# Patient Record
Sex: Male | Born: 1995 | Marital: Single
Health system: Southern US, Community
[De-identification: ages and names within clinical notes are randomized; demographics above are authoritative.]

---
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
## 2013-07-27 ENCOUNTER — Ambulatory Visit: Payer: Self-pay | Admitting: Otolaryngology

## 2013-07-28 LAB — PATHOLOGY REPORT

## 2014-05-05 NOTE — H&P (Signed)
    Subjective/Chief Complaint Right perianal/gluteal pain x 1 week    History of Present Illness [name] is a pleasant 19 yo M who presents with approx 1 week of perianal pain.  He says that his pain began on Monday acutely.  He had some purulent discharge on his underwear and felt an area of induration.  He said that his discharge became worse and pain increased as well.  Has been on augmentin x 3 days.  Dr. Bluford Kaufmannh performed colonoscopy today and believed that he saw a wound concerning for a fistulous opening.  No fevers, chills, night sweats, shortness of breath, cough, dysuria/hematuria, abdominal pain, nausea/vomiting, diarrhea/constipation.   Family and Social History:   Family History Non-Contributory    Social History negative tobacco, negative ETOH, negative Illicit drugs    Place of Living Home  With mother and father   Review of Systems:   Fever/Chills No    Cough No    Sputum No    Abdominal Pain No    Diarrhea No    Constipation No    Nausea/Vomiting No    SOB/DOE No    Chest Pain No    Dysuria No    Tolerating Diet Yes   Physical Exam:   GEN well developed, no acute distress    HEENT pink conjunctivae, PERRL, hearing intact to voice, good dentition    NECK No masses    RESP normal resp effort  clear BS  no use of accessory muscles    CARD regular rate  no murmur  No LE edema    ABD denies tenderness  denies Flank Tenderness  no hernia  soft  normal BS  no Abdominal Bruits  no Adominal Mass    EXTR negative cyanosis/clubbing, negative edema    SKIN No rashes, No ulcers    NEURO negative rigidity, negative tremor    PSYCH alert, A+O to time, place, person, good insight    Additional Comments + Perianal pain with midline purulent drainage posterior to anus, area of induration deep.     Assessment/Admission Diagnosis [name] is a pleasant 19 yo M with probable perianal abscess.    Plan To OR for EUA, probable drainage of perianal abscess    Electronic Signatures: Jarvis NewcomerLundquist, Nicholus Chandran A (MD)  (Signed 31-Jan-14 14:50)  Authored: CHIEF COMPLAINT and HISTORY, FAMILY AND SOCIAL HISTORY, REVIEW OF SYSTEMS, PHYSICAL EXAM, ASSESSMENT AND PLAN   Last Updated: 31-Jan-14 14:50 by Jarvis NewcomerLundquist, Doryan Bahl A (MD)

## 2014-05-06 NOTE — Op Note (Signed)
PATIENT NAME:  Roberto Navarro, Roberto Navarro MR#:  [id number] DATE OF BIRTH:  [birth year]  DATE OF PROCEDURE:  07/27/2013  PREOPERATIVE DIAGNOSES:  1. Septal deviation.  2. Airway obstruction. 3. Turbinate hypertrophy.  4. Chronic bilateral maxillary sinusitis.  5. Chronic bilateral frontal sinusitis.   POSTOPERATIVE DIAGNOSES:  1. Septal deviation.  2. Airway obstruction. 3. Turbinate hypertrophy.  4. Chronic bilateral maxillary sinusitis.  5. Chronic bilateral frontal sinusitis.   OPERATIVE PROCEDURES:  1. Septoplasty.  2. Bilateral endoscopic maxillary antrostomies.  3. Bilateral endoscopic frontal sinusotomies.  4. Bilateral inferior partial turbinate reduction.  5. Use of image-guided system.   SURGEON: Cammy Copa, M.D.   ANESTHESIA: General.   COMPLICATIONS: None.   TOTAL BLOOD LOSS: 75 mL.  DESCRIPTION OF PROCEDURE: The patient was given general anesthesia by oral endotracheal intubation. He is in the supine position in the operating room. The nose was prepped using 4% Xylocaine mixed with Afrin on cotton pledgets in the nose; 5 mL of 1% Xylocaine with epinephrine 1:200,000 was used for infiltration of the nasal septum on both sides of the nose. The septum was markedly deviated to the right side.   The image-guided system was then brought in. The CT scan was downloaded to the system. The template was then applied to the face and this was registered to the system, as well. There was 0.5 mm of variance, which was good. The suction instruments were registered to the system and these showed great alignment with the patient's anatomy and the system. He was prepped and draped in sterile fashion.   The septum was addressed first. Navarro left hemitransfixion incision was created with elevation of mucoperichondrium on both sides of the quadrangular plate. Once I had this freed up, you could see the cartilage buckled way off to the right side, make Navarro sharp bend, and then go horizontally back to the  maxillary crest that was overhanging this. The horizontal portion of the septum was removed to allow the vertical portion to swing back more towards the midline. The maxillary crest had Navarro big bony spur off to his right side. It was pushing on the inferior turbinate. The mucoperiosteum was elevated over the top of this and then Navarro chisel was used to free up some of the maxillary bone and remove Navarro chunk that was sticking out into the right nasal airway, causing part of the obstruction. The mucoperiosteum was elevated over the vomer and ethmoid plate. There was Navarro large spur posteriorly on the left side of the vomer. This was fractured and removed. Some of the ethmoid plate was removed, as well. The rest was sitting in the midline. The mucosal flaps were allowed to fall back into the midline, and this opened up the airway significantly on the right side. The inferior turbinates were trimmed slightly along their inferior border. They were cauterized Navarro little bit here, and the remaining portion was outfractured to create more airway on both sides of the nose and more visualization.   The mucosal flaps of the septum were sutured back in position using Navarro 4-0 plain gut suture in Navarro through and through whipstitch fashion, and 3-0 chromics were used to anchor the septum back to the midline at the anterior nasal spine.   Navarro zero-degree scope was then used for visualizing the nose, opening the airway on both sides. Navarro little more of local anesthesia on Navarro 25-gauge spinal needle was used for infiltration of the uncinate process on both sides, Navarro total of 2  mm more of local was used bilaterally.   The left side was visualized first.  Dictation ends here.  Please see addendum for continuation of dictation.     ____________________________ Cammy CopaPaul H. Deepika Decatur, MD phj:jr D: 07/27/2013 17:19:00 ET T: 07/27/2013 18:30:24 ET JOB#: [id number]  cc: Cammy CopaPaul H. Floetta Brickey, MD, <Dictator> Cammy CopaPAUL H Maynor Mwangi MD ELECTRONICALLY SIGNED 07/28/2013  9:01

## 2014-05-06 NOTE — Op Note (Signed)
PATIENT NAME:  Roberto Navarro, Kipton A MR#:  [id number] DATE OF BIRTH:  [birth year]  DATE OF PROCEDURE:  07/27/2013  CONTINUATION OF DICTATION  The zero-degree scope was used for visualization of the left side first. The middle turbinate was infractured to see the middle meatus better. The uncinate process was very long and floppy. I tried to cannulize into the maxillary sinus and had to trim a little bit of the uncinate process to get into there. I used the Acclarent balloon system to dilate the opening here. I then used the backbiting forceps to trim more of the uncinate process and around the opening to the maxillary sinus, to make sure that there was a clear visual opening into the maxillary sinus. There is no significant disease in the sinus right now.   The uncinate process was trimmed some anteriorly. The Acclarent balloon system was then used for canalizing the left frontal sinus. You could see the pinpoint light in the frontal sinus. This was dilated to 12 cm of pressure, as well. The frontal sinus instruments were then used to trim the opening around the frontal sinus duct to make sure that this was removed. Some bone was removed from the area to clear this and have a good opening into the frontal sinus. A cottonoid pledget was placed here on the left side once these both were opened up to help with vasoconstriction.   The zero-degree scope was used on the right side to visualize the sinuses. The middle turbinate was very lateralized because of the ethmoid plate being pulled over to the right. This was medialized to provide better access into the middle meatus. Again, the maxillary sinus was cannulated first with the Acclarent balloon system. Once it was opened up, the uncinate process had to be trimmed some and provide a better opening here. The Diego microdebrider was used along the uncinate process, as well as the opening in the maxillary antrum. The uncinate process was removed more anteriorly to expose  part of the Agger Nasi cell. The frontal sinus, it was very hard to find the opening here. Had to use the frontal sinus instruments to remove more and more of the anterior ethmoid air cells to finally be able to get into the frontal sinus duct. Once I was there, I dilated to 12 cm of pressure. I then used through-biting forceps to widen this and make sure the opening was clear. A cottonoid pledget was placed here temporarily again.   The left side was revisualized. The frontal sinus duct was opened and the maxillary sinus was opened. You could see clearly and above. Xerogel was placed in the middle meatus to help prevent scarring in the area. On the right side, this was revisualized again, and the frontal sinus and maxillary sinus were open and clear. Again, xerogel was placed in the middle meatus to prevent scarring. The inferior airway was open and clear on both sides. The nasal septum was straight. Xomed 0.5 mm regular size splints were then placed on both sides of the nasal septum and held in place with a 3-0 nylon through and through suture.   The patient tolerated the procedure well. He was awakened and taken to the recovery room in satisfactory condition. Total estimated blood loss was 75 mL.    ____________________________ Cammy CopaPaul H. Zriyah Kopplin, MD phj:jr D: 07/27/2013 17:24:42 ET T: 07/27/2013 18:39:43 ET JOB#: [id number]  cc: Cammy CopaPaul H. Andriana Casa, MD, <Dictator> Cammy CopaPAUL H Trany Chernick MD ELECTRONICALLY SIGNED 07/28/2013 9:01

## 2017-03-18 ENCOUNTER — Other Ambulatory Visit: Payer: Self-pay | Admitting: Gastroenterology

## 2017-03-18 DIAGNOSIS — R1013 Epigastric pain: Secondary | ICD-10-CM

## 2017-03-24 ENCOUNTER — Ambulatory Visit
Admission: RE | Admit: 2017-03-24 | Discharge: 2017-03-24 | Disposition: A | Discharge status: Home / Self Care | Payer: 59 | Source: Ambulatory Visit | Attending: Gastroenterology | Admitting: Gastroenterology

## 2017-03-24 DIAGNOSIS — R1013 Epigastric pain: Secondary | ICD-10-CM | POA: Diagnosis not present

## 2019-10-20 IMAGING — US US ABDOMEN COMPLETE
1 series · 14 of 25 positions shown · non-contrast
Comparison: None.

CLINICAL DATA: Epigastric abdominal pain.

EXAM:
ABDOMEN ULTRASOUND COMPLETE

[Series 1: us abdomen complete · 14 of 91 slices shown]
[im 1/91]
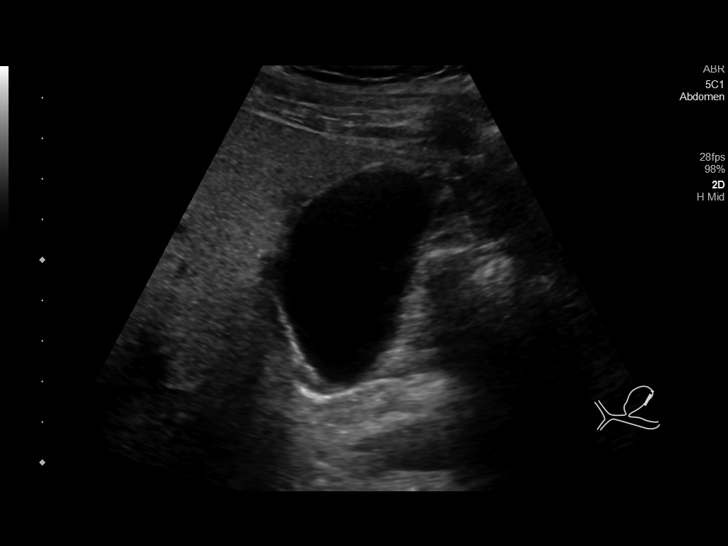
[im 8/91]
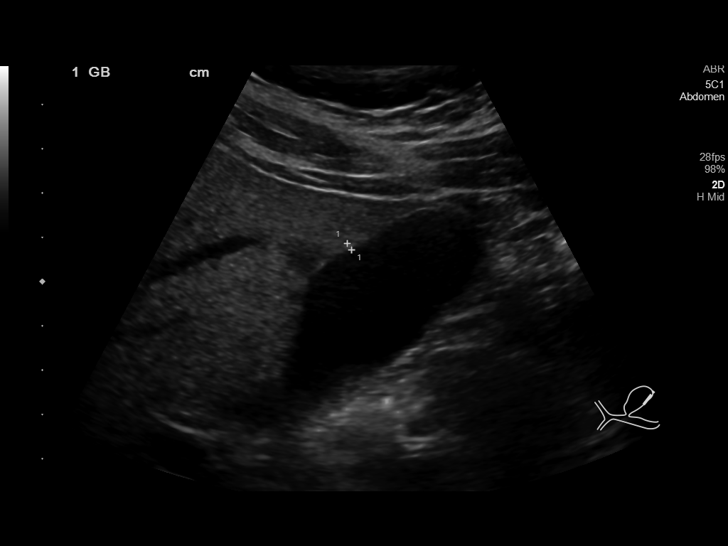
[im 16/91]
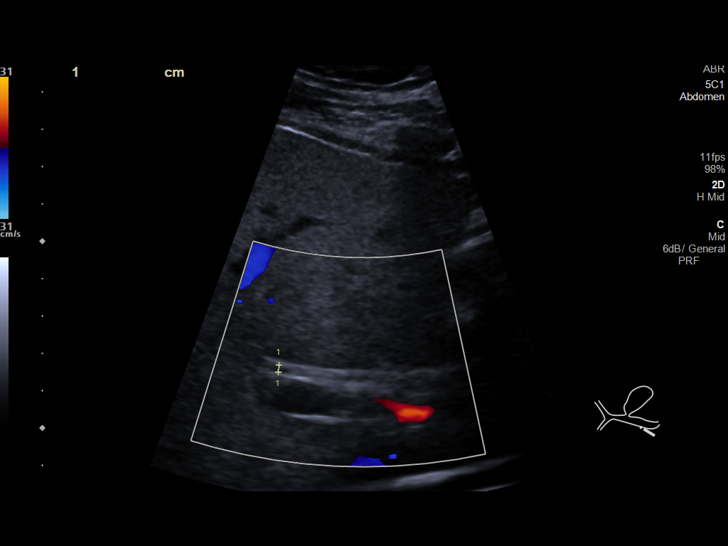
[im 23/91]
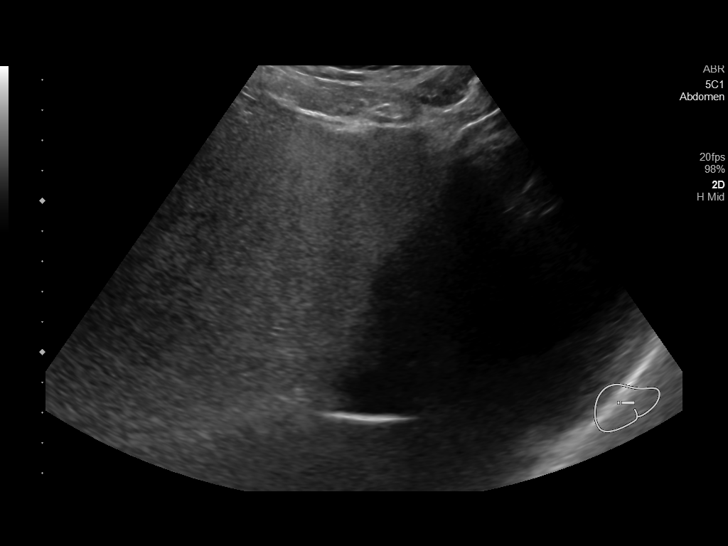
[im 31/91]
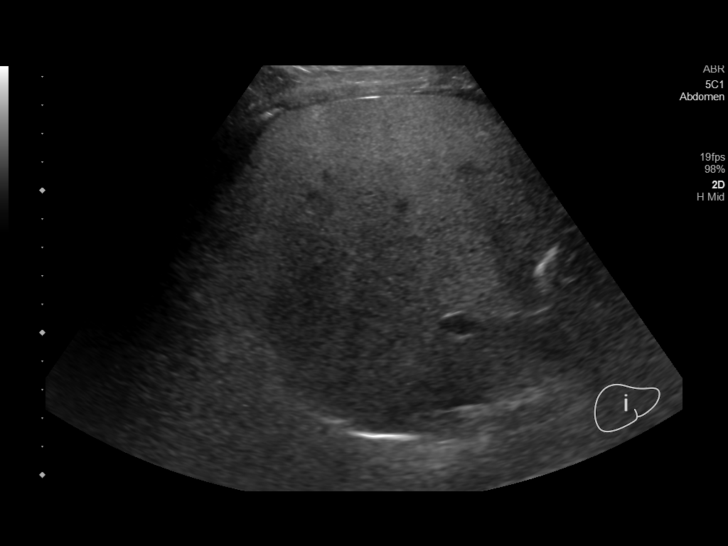
[im 34/91]
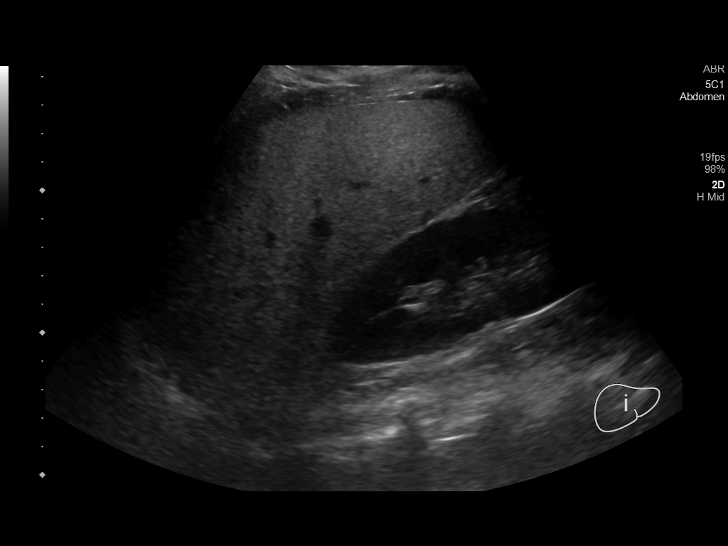
[im 42/91]
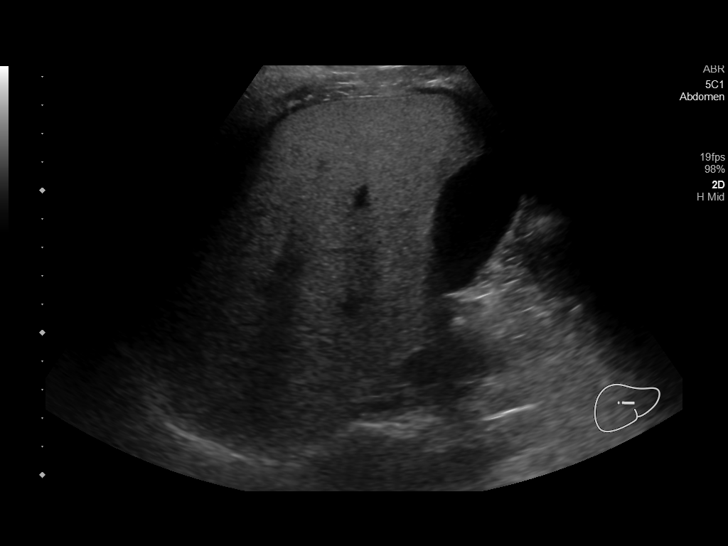
[im 49/91]
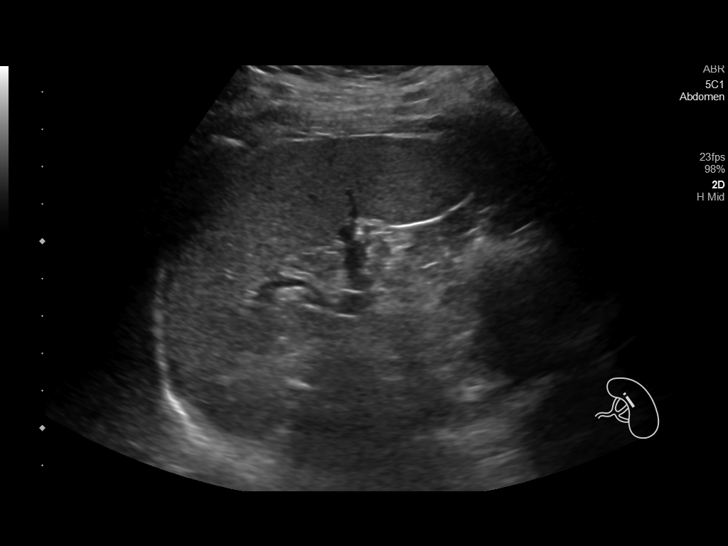
[im 57/91]
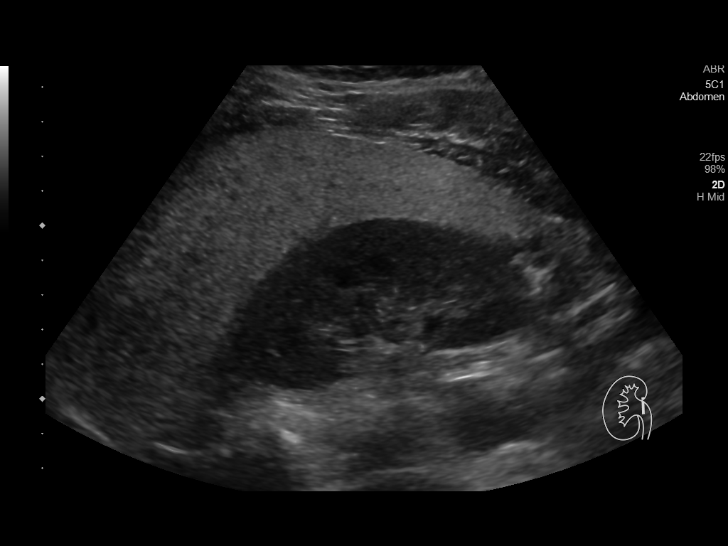
[im 61/91]
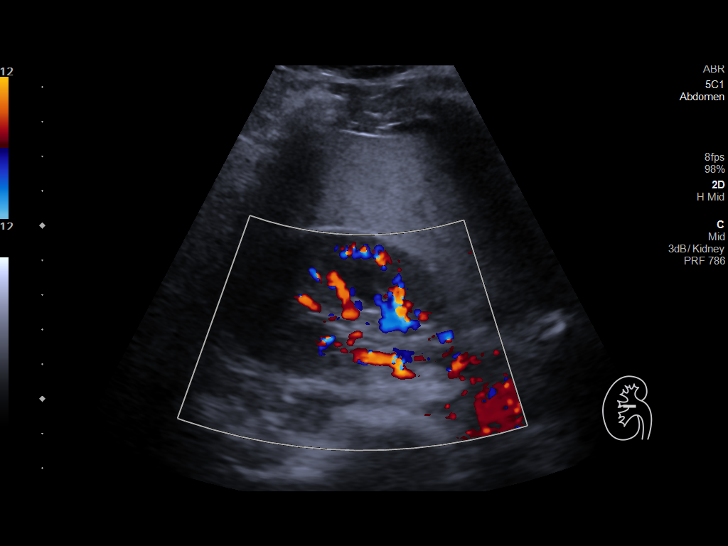
[im 68/91]
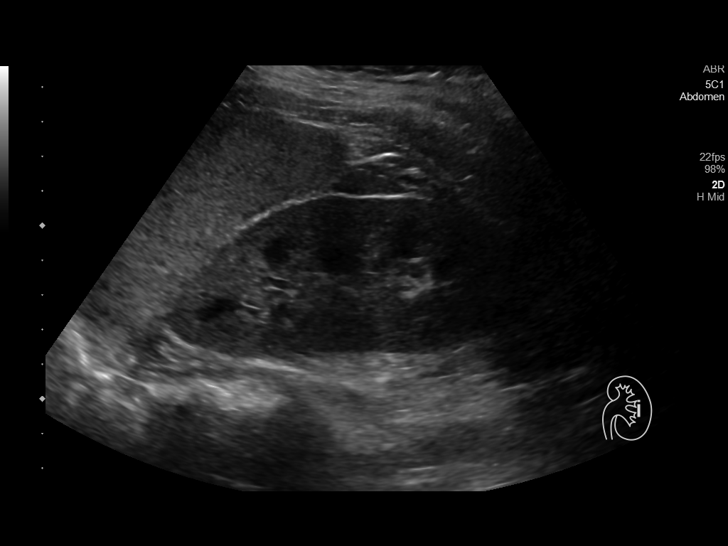
[im 76/91]
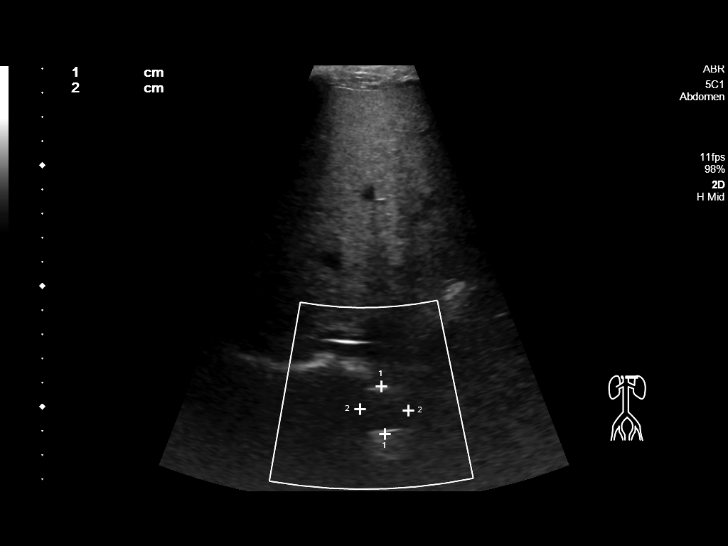
[im 83/91]
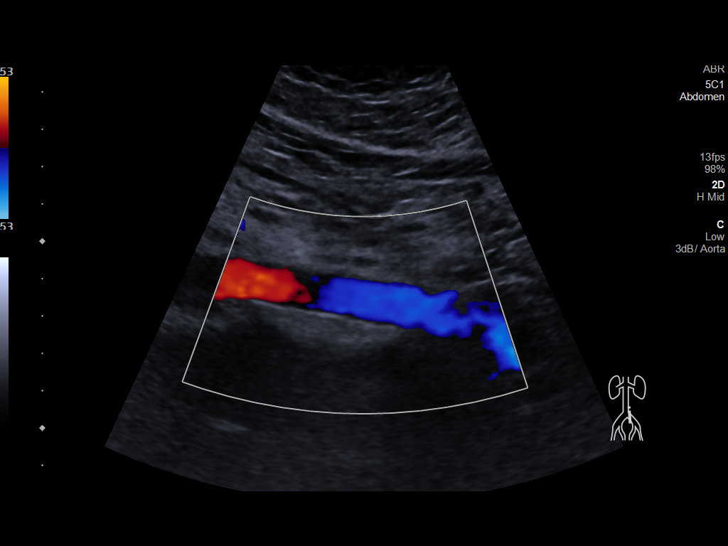
[im 91/91]
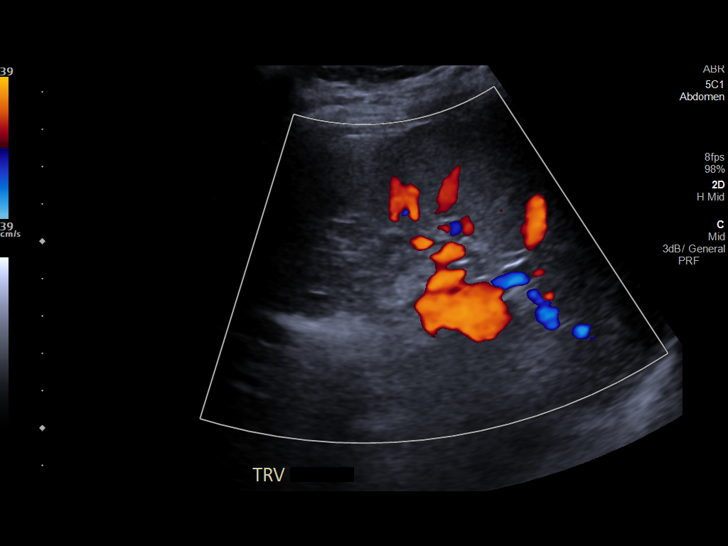

[14 of 25 positions shown; findings below may reference images not displayed]

FINDINGS: Gallbladder: No gallstones or wall thickening visualized. No
sonographic Murphy sign noted by sonographer.

Common bile duct: Diameter: 2 mm which is within normal limits.

Liver: No focal lesion identified. Increased echogenicity of hepatic
parenchyma is noted consistent with fatty infiltration, with sparing
adjacent to gallbladder fossa.. Portal vein is patent on color
Doppler imaging with normal direction of blood flow towards the
liver.

IVC: No abnormality visualized.

Pancreas: Visualized portion unremarkable.

Spleen: Size and appearance within normal limits.

Right Kidney: Length: 10.4 cm. Echogenicity within normal limits. No
mass or hydronephrosis visualized.

Left Kidney: Length: 11.8 cm. Echogenicity within normal limits. No
mass or hydronephrosis visualized.

Abdominal aorta: No aneurysm visualized.

Other findings: None.
IMPRESSION: Increased echogenicity of hepatic parenchyma is noted consistent
with fatty infiltration, with probable sparing adjacent to
gallbladder fossa. No other abnormality seen in the abdomen.

## 2020-09-09 ENCOUNTER — Other Ambulatory Visit: Payer: Self-pay | Admitting: Internal Medicine

## 2022-02-17 ENCOUNTER — Other Ambulatory Visit: Payer: Self-pay | Admitting: Internal Medicine

## 2022-03-24 ENCOUNTER — Other Ambulatory Visit: Payer: Self-pay | Admitting: Internal Medicine

## 2022-03-25 ENCOUNTER — Other Ambulatory Visit: Payer: Self-pay | Admitting: Internal Medicine

## 2022-06-16 ENCOUNTER — Other Ambulatory Visit: Payer: Self-pay | Admitting: Internal Medicine

## 2022-07-19 ENCOUNTER — Other Ambulatory Visit: Payer: Self-pay | Admitting: Internal Medicine

## 2022-07-19 DIAGNOSIS — H01005 Unspecified blepharitis left lower eyelid: Secondary | ICD-10-CM

## 2022-07-19 MED ORDER — TOBRADEX 0.3-0.1 % OP OINT: 1.0000 | TOPICAL_OINTMENT | Freq: Three times a day (TID) | OPHTHALMIC | 0 refills | Status: AC

## 2023-03-23 ENCOUNTER — Other Ambulatory Visit: Payer: Self-pay | Admitting: Internal Medicine

## 2023-03-23 DIAGNOSIS — I10 Essential (primary) hypertension: Secondary | ICD-10-CM

## 2023-03-23 MED ORDER — VALSARTAN 160 MG PO TABS: 160.0000 mg | ORAL_TABLET | Freq: Every day | ORAL | 3 refills | Status: DC

## 2023-04-21 ENCOUNTER — Other Ambulatory Visit: Payer: Self-pay | Admitting: Internal Medicine

## 2023-04-21 DIAGNOSIS — H01002 Unspecified blepharitis right lower eyelid: Secondary | ICD-10-CM

## 2023-04-21 MED ORDER — TOBRAMYCIN-DEXAMETHASONE 0.3-0.1 % OP OINT: 1.0000 | TOPICAL_OINTMENT | Freq: Two times a day (BID) | OPHTHALMIC | 1 refills | Status: AC

## 2023-10-13 ENCOUNTER — Other Ambulatory Visit: Payer: Self-pay | Admitting: Internal Medicine

## 2023-10-13 DIAGNOSIS — I10 Essential (primary) hypertension: Secondary | ICD-10-CM

## 2023-10-13 MED ORDER — VALSARTAN 160 MG PO TABS: 160.0000 mg | ORAL_TABLET | Freq: Every day | ORAL | 3 refills | Status: DC

## 2023-10-23 ENCOUNTER — Other Ambulatory Visit: Payer: Self-pay | Admitting: Internal Medicine

## 2023-10-23 DIAGNOSIS — I10 Essential (primary) hypertension: Secondary | ICD-10-CM

## 2023-10-23 MED ORDER — VALSARTAN-HYDROCHLOROTHIAZIDE 160-12.5 MG PO TABS
1.0000 | ORAL_TABLET | Freq: Every day | ORAL | 0 refills | Status: DC
Start: 2023-10-23 — End: 2023-11-24

## 2023-10-23 MED ORDER — VALSARTAN-HYDROCHLOROTHIAZIDE 160-12.5 MG PO TABS: 1.0000 | ORAL_TABLET | Freq: Every day | ORAL | 3 refills | Status: DC

## 2023-11-24 ENCOUNTER — Other Ambulatory Visit: Payer: Self-pay | Admitting: Internal Medicine

## 2023-11-24 DIAGNOSIS — I10 Essential (primary) hypertension: Secondary | ICD-10-CM
